# Patient Record
Sex: Male | Born: 1995
Health system: Southern US, Community
[De-identification: ages and names within clinical notes are randomized; demographics above are authoritative.]

## PROBLEM LIST (undated history)

## (undated) DIAGNOSIS — J45909 Unspecified asthma, uncomplicated: Secondary | ICD-10-CM

## (undated) DIAGNOSIS — J40 Bronchitis, not specified as acute or chronic: Secondary | ICD-10-CM

## (undated) DIAGNOSIS — IMO0001 Reserved for inherently not codable concepts without codable children: Secondary | ICD-10-CM

## (undated) HISTORY — DX: Reserved for inherently not codable concepts without codable children: IMO0001

---
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
## 2016-05-01 ENCOUNTER — Ambulatory Visit (INDEPENDENT_AMBULATORY_CARE_PROVIDER_SITE_OTHER): Payer: 59 | Admitting: Family Medicine

## 2016-05-01 ENCOUNTER — Encounter: Payer: Self-pay | Admitting: Family Medicine

## 2016-05-01 VITALS — BP 113/74 | HR 52 | Temp 97.6°F | Resp 16 | Ht 73.5 in | Wt 154.0 lb

## 2016-05-01 DIAGNOSIS — Z0000 Encounter for general adult medical examination without abnormal findings: Secondary | ICD-10-CM | POA: Diagnosis not present

## 2016-05-01 DIAGNOSIS — Z23 Encounter for immunization: Secondary | ICD-10-CM

## 2016-05-01 DIAGNOSIS — Z111 Encounter for screening for respiratory tuberculosis: Secondary | ICD-10-CM

## 2016-05-01 LAB — POCT URINALYSIS DIPSTICK
Bilirubin, UA: NEGATIVE
Blood, UA: NEGATIVE
GLUCOSE UA: NEGATIVE
Ketones, UA: NEGATIVE
LEUKOCYTES UA: NEGATIVE
NITRITE UA: NEGATIVE
PROTEIN UA: NEGATIVE
Spec Grav, UA: 1.01
UROBILINOGEN UA: 0.2
pH, UA: 6

## 2016-05-01 LAB — HEMOGLOBIN AND HEMATOCRIT, BLOOD
HEMATOCRIT: 42.7 % (ref 36.0–49.0)
HEMOGLOBIN: 14.4 g/dL (ref 12.0–16.0)

## 2016-05-01 NOTE — Patient Instructions (Signed)
Consider seeing an optometrist due to mild decrease in visual acuity, esp if it becomes a problem seeing in classrooms at school.

## 2016-05-01 NOTE — Progress Notes (Signed)
Office Note 05/01/2016  CC:  Chief Complaint  Patient presents with  . Annual Exam   HPI:  Jay Valenzuela is a 20 y.o. White male who is here for annual health maintenance exam. Will be starting school at [employer] this fall.  He did not get to attend college last year due to some family issues.  No acute complaints. 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 He denies hearing c/o. Says occ he has problem with far vision when driving--but rare.   Past Medical History:  Diagnosis Date  . Closed fracture of tibial tubercle 01/2013   Right (referred to [place] by [facility] urgent Care on [address])    History reviewed. No pertinent surgical history.  Family History  Problem Relation Age of Onset  . Alcohol abuse Mother   . Alcohol abuse Father     Social History   Social History  . Marital status: Single    Spouse name: N/A  . Number of children: N/A  . Years of education: N/A   Occupational History  . Not on file.   Social History Main Topics  . Smoking status: Never Smoker  . Smokeless tobacco: Never Used  . Alcohol use No  . Drug use: No  . Sexual activity: Not on file   Other Topics Concern  . Not on file   Social History Narrative   Starting [employer] fall 2016, plans on going to [employer] after.     Born in [place] but has lived in [place] since age 15.     Lives with GM.  Mom is an alcoholic who is not in his life, dad is alcholic/drug addict who's whereabouts are unknown.     No tob/alc/drugs.    MEDS: none  No Known Allergies  ROS Review of Systems  Constitutional: Negative for appetite change, chills, fatigue and fever.  HENT: Negative for congestion, dental problem, ear pain and sore throat.   Eyes: Negative for discharge, redness and visual disturbance.  Respiratory: Negative for cough, chest tightness, shortness of breath and wheezing.   Cardiovascular: Negative for chest pain, palpitations and leg swelling.  Gastrointestinal: Negative  for abdominal pain, blood in stool, diarrhea, nausea and vomiting.  Genitourinary: Negative for difficulty urinating, dysuria, flank pain, frequency, hematuria and urgency.  Musculoskeletal: Negative for arthralgias, back pain, joint swelling, myalgias and neck stiffness.  Skin: Negative for pallor and rash.  Neurological: Negative for dizziness, speech difficulty, weakness and headaches.  Hematological: Negative for adenopathy. Does not bruise/bleed easily.  Psychiatric/Behavioral: Negative for confusion and sleep disturbance. The patient is not nervous/anxious.     PE; Blood pressure 113/74, pulse (!) 52, temperature 97.6 F (36.4 C), temperature source Oral, resp. rate 16, height 6' 1.5" (1.867 m), weight 154 lb (69.9 kg), SpO2 100 %. Gen: Alert, well appearing.  Patient is oriented to person, place, time, and situation. AFFECT: pleasant, lucid thought and speech. ENT: Ears: EACs clear, normal epithelium.  TMs with good light reflex and landmarks bilaterally.  Eyes: no injection, icteris, swelling, or exudate.  EOMI, PERRLA. Nose: no drainage or turbinate edema/swelling.  No injection or focal lesion.  Mouth: lips without lesion/swelling.  Oral mucosa pink and moist.  Dentition intact and without obvious caries or gingival swelling.  Oropharynx without erythema, exudate, or swelling.  Neck: supple/nontender.  No LAD, mass, or TM.  Carotid pulses 2+ bilaterally, without bruits. CV: RRR, no m/r/g.   LUNGS: CTA bilat, nonlabored resps, good aeration in all lung fields.  ABD: soft, NT, ND, BS normal.  No hepatospenomegaly or mass.  No bruits. EXT: no clubbing, cyanosis, or edema.  Musculoskeletal: no joint swelling, erythema, warmth, or tenderness.  ROM of all joints intact. Skin - no sores or suspicious lesions or rashes or color changes   Pertinent labs:  UA: normal  Vision screen: 20/20 OU, 20/30 OS, 20/40 OD.  ASSESSMENT AND PLAN:   Health maintenance exam: Reviewed age and gender  appropriate health maintenance issues (prudent diet, regular exercise, health risks of tobacco and excessive alcohol, use of seatbelts, fire alarms in home, use of sunscreen).  Also reviewed age and gender appropriate health screening as well as vaccine recommendations. Tdap today as well as TB skin test (as per his school's requirements--[employer]). UA today--normal, also as per school's requirements.   Hb/Hct sent out today as per school form's requirement. Recommended pt see optometrist for further evaluation of mild decreased visual acuity.  An After Visit Summary was printed and given to the patient.  FOLLOW UP:  Return in about 1 year (around 05/01/2017).  Signed:  Crissie Sickles, MD           05/01/2016

## 2016-05-02 ENCOUNTER — Encounter: Payer: Self-pay | Admitting: *Deleted

## 2016-06-12 ENCOUNTER — Encounter: Payer: Self-pay | Admitting: Family Medicine

## 2016-06-12 ENCOUNTER — Ambulatory Visit (INDEPENDENT_AMBULATORY_CARE_PROVIDER_SITE_OTHER): Payer: 59 | Admitting: Family Medicine

## 2016-06-12 VITALS — BP 129/72 | HR 88 | Temp 98.2°F | Resp 20 | Wt 150.1 lb

## 2016-06-12 DIAGNOSIS — R0602 Shortness of breath: Secondary | ICD-10-CM

## 2016-06-12 DIAGNOSIS — R072 Precordial pain: Secondary | ICD-10-CM | POA: Diagnosis not present

## 2016-06-12 DIAGNOSIS — R0789 Other chest pain: Secondary | ICD-10-CM

## 2016-06-12 DIAGNOSIS — R011 Cardiac murmur, unspecified: Secondary | ICD-10-CM

## 2016-06-12 DIAGNOSIS — R000 Tachycardia, unspecified: Secondary | ICD-10-CM

## 2016-06-12 NOTE — Progress Notes (Signed)
Pre visit review using our clinic review tool, if applicable. No additional management support is needed unless otherwise documented below in the visit note. 

## 2016-06-12 NOTE — Progress Notes (Signed)
OFFICE VISIT  06/12/2016   CC:  Chief Complaint  Patient presents with  . Cough    chest pressure, shortness of breath x 2 days   HPI:    Patient is a 20 y.o. Caucasian male who presents for a feeling of SOB and pressure/tightness in chest. Onset 2 days ago, initially felt tired, a little dizzy, some pressure in central chest that was mild/intermittent at first and got much worse last night.  Chest pain/discomfort worse lying supine, alleviated some by sitting upright or a little forward.  Slight dry cough today.  No nasal cong/runny nose, no ST.  Denies hearing any wheezing. When he walks around he feels dizzy and out of breath.  No fevers.  No rash.  No myalgias/arthralgias.  Past Medical History:  Diagnosis Date  . Closed fracture of tibial tubercle 01/2013   Right (referred to [place] by [facility] urgent [facility] on [address])    No past surgical history on file.  No outpatient prescriptions prior to visit.   No facility-administered medications prior to visit.     No Known Allergies  ROS As per HPI  PE: Blood pressure 129/72, pulse 88, temperature 98.2 F (36.8 C), temperature source Oral, resp. rate 20, weight 150 lb 1.9 oz (68.1 kg), SpO2 98 %. Gen: Alert, well appearing.  Patient is oriented to person, place, time, and situation. ENT: Ears: EACs clear, normal epithelium.  TMs with good light reflex and landmarks bilaterally.  Eyes: no injection, icteris, swelling, or exudate.  EOMI, PERRLA. Nose: no drainage or turbinate edema/swelling.  No injection or focal lesion.  Mouth: lips without lesion/swelling.  Oral mucosa pink and moist.  Dentition intact and without obvious caries or gingival swelling.  Oropharynx without erythema, exudate, or swelling.  Neck - No masses or thyromegaly or limitation in range of motion CV: RRR, 2/6 systolic murmur heard best at left lower sternal border and apex.  No rub or gallop. I then listened after another 10 min and his murmur  could not be heard. Chest is clear, no wheezing or rales. Normal symmetric air entry throughout both lung fields. No chest wall deformities or tenderness. EXT: no clubbing, cyanosis, or edema.  SKIN: no rash  LABS:   12 lead EKG today: sinus rhythm, rate 64, right atrial enlargement.  No prior for comparison.  IMPRESSION AND PLAN:  1) SOB x 2d, + chest tightness and intermittent racing heart. He does not have any signs that this is asthma/RAD. His heart murmur (new) seems to come and go and he has no lung sounds or periph edema that would be c/w HF.  At this point:  Want to r/o PE first with CT angio chest ASAP.  Other possibilities (that CT may also be helpful with) are myocarditis and pericarditis.  If all imaging neg and nothing to support these dx, then will obtain echocardiogram. Check CBC w/diff, CMET, and TSH.  No meds rx'd today.  Spent 40 min with pt today, with >50% of this time spent in counseling and care coordination regarding the above problems.  FOLLOW UP: To be determined based on results of pending workup.  Signed:  Crissie Sickles, MD           06/12/2016

## 2016-06-13 ENCOUNTER — Telehealth: Payer: Self-pay | Admitting: Family Medicine

## 2016-06-13 ENCOUNTER — Ambulatory Visit (HOSPITAL_BASED_OUTPATIENT_CLINIC_OR_DEPARTMENT_OTHER): Admission: RE | Admit: 2016-06-13 | Payer: 59 | Source: Ambulatory Visit

## 2016-06-13 LAB — CBC WITH DIFFERENTIAL/PLATELET
BASOS ABS: 0 10*3/uL (ref 0.0–0.1)
Basophils Relative: 0.4 % (ref 0.0–3.0)
Eosinophils Absolute: 0.2 10*3/uL (ref 0.0–0.7)
Eosinophils Relative: 3 % (ref 0.0–5.0)
HEMATOCRIT: 43.5 % (ref 36.0–49.0)
HEMOGLOBIN: 15.1 g/dL (ref 12.0–16.0)
LYMPHS PCT: 23.7 % — AB (ref 24.0–48.0)
Lymphs Abs: 1.8 10*3/uL (ref 0.7–4.0)
MCHC: 34.6 g/dL (ref 31.0–37.0)
MCV: 93.5 fl (ref 78.0–98.0)
MONOS PCT: 5.6 % (ref 3.0–12.0)
Monocytes Absolute: 0.4 10*3/uL (ref 0.1–1.0)
NEUTROS ABS: 5 10*3/uL (ref 1.4–7.7)
Neutrophils Relative %: 67.3 % (ref 43.0–71.0)
PLATELETS: 301 10*3/uL (ref 150.0–575.0)
RBC: 4.65 Mil/uL (ref 3.80–5.70)
RDW: 12.6 % (ref 11.4–15.5)
WBC: 7.5 10*3/uL (ref 4.5–13.5)

## 2016-06-13 LAB — COMPREHENSIVE METABOLIC PANEL
ALBUMIN: 4.8 g/dL (ref 3.5–5.2)
ALK PHOS: 61 U/L (ref 52–171)
ALT: 12 U/L (ref 0–53)
AST: 15 U/L (ref 0–37)
BILIRUBIN TOTAL: 0.7 mg/dL (ref 0.2–1.2)
BUN: 10 mg/dL (ref 6–23)
CALCIUM: 9.8 mg/dL (ref 8.4–10.5)
CO2: 30 mEq/L (ref 19–32)
Chloride: 103 mEq/L (ref 96–112)
Creatinine, Ser: 0.85 mg/dL (ref 0.40–1.50)
GFR: 123.06 mL/min (ref >60.00)
Glucose, Bld: 70 mg/dL (ref 70–99)
Potassium: 4.4 mEq/L (ref 3.5–5.1)
Sodium: 140 mEq/L (ref 135–145)
TOTAL PROTEIN: 7.6 g/dL (ref 6.0–8.3)

## 2016-06-13 LAB — TSH: TSH: 1.67 u[IU]/mL (ref 0.40–5.00)

## 2016-06-13 NOTE — Telephone Encounter (Signed)
Carolynn from the [facility] called today to let us know that this patient called to cancel his appt for hie PE CT scan.  Patient stated that he "felt better today".

## 2016-06-13 NOTE — Telephone Encounter (Signed)
Noted  

## 2016-06-18 ENCOUNTER — Ambulatory Visit: Payer: 59 | Admitting: Family Medicine

## 2016-10-30 ENCOUNTER — Ambulatory Visit (INDEPENDENT_AMBULATORY_CARE_PROVIDER_SITE_OTHER): Payer: 59 | Admitting: *Deleted

## 2016-10-30 DIAGNOSIS — Z23 Encounter for immunization: Secondary | ICD-10-CM

## 2016-11-01 ENCOUNTER — Ambulatory Visit: Payer: 59

## 2018-07-07 ENCOUNTER — Ambulatory Visit (INDEPENDENT_AMBULATORY_CARE_PROVIDER_SITE_OTHER): Payer: Self-pay

## 2018-07-07 ENCOUNTER — Other Ambulatory Visit: Payer: Self-pay | Admitting: Gerontology

## 2018-07-07 DIAGNOSIS — Z0000 Encounter for general adult medical examination without abnormal findings: Secondary | ICD-10-CM

## 2018-08-11 ENCOUNTER — Encounter: Payer: Self-pay | Admitting: *Deleted

## 2019-02-26 ENCOUNTER — Encounter: Payer: 59 | Admitting: Family Medicine

## 2019-04-02 ENCOUNTER — Telehealth: Payer: Self-pay | Admitting: Family Medicine

## 2019-04-02 NOTE — Telephone Encounter (Signed)
Please schedule TOC to Dr. Jerline Pain

## 2019-04-02 NOTE — Telephone Encounter (Signed)
Called pt to schedule, No answer, LVM.

## 2019-04-02 NOTE — Telephone Encounter (Signed)
OK with me.

## 2019-04-02 NOTE — Telephone Encounter (Signed)
Patient would like to transfer care from Dr. Anitra Lauth to a [facility] provider. Please advise on approval or denial of transfer.    I included all providers at [facility] accepting patients due to no provider specified by patient. I will schedule with whomever responds first.   Thank you

## 2019-04-02 NOTE — Telephone Encounter (Signed)
Ok with transfer 

## 2020-01-17 ENCOUNTER — Other Ambulatory Visit: Payer: Self-pay

## 2020-01-17 ENCOUNTER — Encounter: Payer: Self-pay | Admitting: Family Medicine

## 2020-01-17 ENCOUNTER — Ambulatory Visit: Payer: 59 | Admitting: Family Medicine

## 2020-01-17 ENCOUNTER — Ambulatory Visit (INDEPENDENT_AMBULATORY_CARE_PROVIDER_SITE_OTHER): Payer: BC Managed Care – PPO | Admitting: Family Medicine

## 2020-01-17 VITALS — BP 133/82 | HR 72 | Temp 98.4°F | Resp 16 | Ht 73.5 in | Wt 187.4 lb

## 2020-01-17 DIAGNOSIS — Z0000 Encounter for general adult medical examination without abnormal findings: Secondary | ICD-10-CM | POA: Diagnosis not present

## 2020-01-17 DIAGNOSIS — G8929 Other chronic pain: Secondary | ICD-10-CM | POA: Diagnosis not present

## 2020-01-17 DIAGNOSIS — M222X1 Patellofemoral disorders, right knee: Secondary | ICD-10-CM | POA: Diagnosis not present

## 2020-01-17 DIAGNOSIS — Z131 Encounter for screening for diabetes mellitus: Secondary | ICD-10-CM

## 2020-01-17 DIAGNOSIS — S86891A Other injury of other muscle(s) and tendon(s) at lower leg level, right leg, initial encounter: Secondary | ICD-10-CM | POA: Diagnosis not present

## 2020-01-17 DIAGNOSIS — M25561 Pain in right knee: Secondary | ICD-10-CM | POA: Diagnosis not present

## 2020-01-17 DIAGNOSIS — Z13220 Encounter for screening for lipoid disorders: Secondary | ICD-10-CM

## 2020-01-17 NOTE — Patient Instructions (Signed)
Patellar Tendinitis Rehab Ask your health care provider which exercises are safe for you. Do exercises exactly as told by your health care provider and adjust them as directed. It is normal to feel mild stretching, pulling, tightness, or discomfort as you do these exercises. Stop right away if you feel sudden pain or your pain gets worse. Do not begin these exercises until told by your health care provider. Stretching and range-of-motion exercise This exercise warms up your muscles and joints and improves the movement and flexibility of your knee. The exercise also helps to relieve pain and stiffness. Hamstring, doorway stretch This is an exercise in which you lie in a doorway and prop your leg on a wall to stretch the back of your knee and thigh (hamstring). 1. Lie on your back in front of a doorway with your left / right leg resting against the wall and your other leg flat on the floor in the doorway. There should be a slight bend in your left / right knee. 2. Straighten your left / right knee. You should feel a stretch behind your knee or thigh. If you do not, scoot your buttocks closer to the door. 3. Hold this position for __________ seconds. Repeat __________ times. Complete this exercise __________ times a day. Strengthening exercises These exercises build strength and endurance in your knee. Endurance is the ability to use your muscles for a long time, even after they get tired. Quadriceps, isometric This exercise stretches the muscles in front of your thigh (quadriceps) without moving your knee joint (isometric). 1. Lie on your back with your left / right leg extended and your other knee bent. 2. Slowly tense the muscles in the front of your left / right thigh. When you do this, you should see your kneecap slide up toward your hip or see increased dimpling just above the knee. This motion will push the back of your knee toward the floor. If this is painful, try putting a rolled-up hand towel  under your knee to support it in a bent position. Change the size of the towel to find a position that allows you to do this exercise without any pain. 3. For __________ seconds, hold the muscle as tight as you can without increasing your pain. 4. Relax the muscles slowly and completely. Repeat __________ times. Complete this exercise __________ times a day. Straight leg raises This exercise stretches the muscles in front of your thigh (quadriceps) and the muscles that move your hips (hip flexors). 1. Lie on your back with your left / right leg extended and your other knee bent. 2. Tense the muscles in the front of your left / right thigh. When you do this, you should see your kneecap slide up or see increased dimpling just above the knee. 3. Keep these muscles tight as you raise your leg 4-6 inches (10-15 cm) off the floor. Do not let your moving knee bend. 4. Hold this position for __________ seconds. 5. Keep these muscles tense as you slowly lower your leg. 6. Relax your muscles slowly and completely. Repeat __________ times. Complete this exercise __________ times a day. Squats This is a weight-bearing exercise in which you bend your knees and lower your hips while engaging your thigh muscles. 1. Stand in front of a table, with your feet and knees pointing straight ahead. You may rest your hands on the table for balance but not for support. 2. Slowly bend your knees and lower your hips like you are going to   sit in a chair. ? Keep your weight over your heels, not over your toes. ? Keep your lower legs upright so they are parallel with the table legs. ? Do not let your hips go lower than your knees. ? Do not bend lower than told by your health care provider. ? If your knee pain increases, do not bend as low. 3. Hold the squat position for __________ seconds. 4. Slowly push with your legs to return to standing. Do not use your hands to pull yourself to standing. Repeat __________ times.  Complete this exercise __________ times a day. Step-downs This is an exercise in which you step down slowly while engaging your leg muscles. 1. Stand on the edge of a step. 2. Keeping your weight over your __________ heel, slowly bend your __________ knee to bring your __________ heel toward the floor. Lower your heel as far as you can while keeping control and without increasing any discomfort. ? Do not let your __________ knee come forward. ? Use your leg muscles, not gravity, to lower your body. ? Hold a wall or rail for balance if needed. 3. Slowly push through your heel to lift your body weight back up. 4. Return to the starting position. Repeat __________ times. Complete this exercise __________ times a day. Straight leg raises This exercise strengthens the muscles that rotate the leg at the hip and move it away from your body (hip abductors). 1. Lie on your side with your left / right leg in the top position. Lie so your head, shoulder, knee, and hip line up. You may bend your lower knee to help you keep your balance. 2. Roll your hips slightly forward, so that your hips are stacked directly over each other and your left / right knee is facing forward. 3. Leading with your heel, lift your top leg 4-6 inches (10-15 cm). You should feel the muscles in your outer hip lifting. ? Do not let your foot drift forward. ? Do not let your knee roll toward the ceiling. 4. Hold this position for __________ seconds. 5. Slowly lower your leg to the starting position. 6. Let your muscles relax completely after each repetition. Repeat __________ times. Complete this exercise __________ times a day. This information is not intended to replace advice given to you by your health care provider. Make sure you discuss any questions you have with your health care provider. Document Revised: 12/31/2018 Document Reviewed: 06/30/2018 Elsevier Patient Education  2020 Elsevier Inc.  

## 2020-01-17 NOTE — Progress Notes (Signed)
CC: Jay Valenzuela is a 24 y.o. White male who is here for annual health maintenance exam and f/u hist of right knee pain.  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 -feels it more when lifting heavy machinery  -knee brace helps  -when walking, feels like leg is loose and "sagging"  -sharp shooting pain down leg and up thigh posteriorly ; tingles in feet  -Dr Pearline Cables at [facility] or something" years ago  -feels like "buckles" and stretches" knee sometimes when on feet for long work days  -has been intermittent since 2014, will sometimes improve and go away but this past month or so has been longest stretch of time it has been bothering him  -freezeice helps pain  -exercise: work at [employer], walk occasionally  -diet: healthy, fruits and veggies and some carbs  -sleeping: hard time falling asleep / staying asleep ; stress related "work dreams" ; avg 5.5hrs/night   --sleeps better in day than at night when able to nap  -no drugs or smoking or vaping  -3 drinks/month -sexually active with girlfriend using contraception (condoms)  -tried Zyrtec for allergies last week and did not like it      PMH: Past Medical History:  Diagnosis Date  . Closed fracture of tibial tubercle 01/2013   Right (referred to [facility] by [facility] on [address])    M/A: No current outpatient medications on file prior to visit.   No current facility-administered medications on file prior to visit.   No Known Allergies  FH: Family History  Problem Relation Age of Onset  . Alcohol abuse Mother   . Alcohol abuse Father     SH: Social History   Socioeconomic History  . Marital status: Single    Spouse name: Not on file  . Number of children: Not on file  . Years of education: Not on file  . Highest education level: Not on file   Occupational History  . Not on file  Tobacco Use  . Smoking status: Never Smoker  . Smokeless tobacco: Never Used  Substance and Sexual Activity  . Alcohol use: No  . Drug use: No  . Sexual activity: Not on file  Other Topics Concern  . Not on file  Social History Narrative   Starting [employer] fall 2016, plans on going to [employer] after.     Born in [place] but has lived in [place] since age 63.     Lives with GM.  Mom is an alcoholic who is not in his life, dad is alcholic/drug addict who's whereabouts are unknown.     No tob/alc/drugs.   Social Determinants of Health   Financial Resource Strain:   . Difficulty of Paying Living Expenses:   Food Insecurity:   . Worried About Charity fundraiser in the Last Year:   . Arboriculturist in the Last Year:   Transportation Needs:   . Film/video editor (Medical):   Marland Kitchen Lack of Transportation (Non-Medical):   Physical Activity:   . Days of Exercise per Week:   . Minutes of Exercise per Session:   Stress:   . Feeling of Stress :   Social Connections:   . Frequency of Communication with Friends and Family:   . Frequency of Social Gatherings with Friends and Family:   . Attends Religious Services:   .  Active Member of Clubs or Organizations:   . Attends Archivist Meetings:   Marland Kitchen Marital Status:     ROS:. Review of Systems  Constitutional: Negative.   HENT: Negative.   Eyes: Negative.   Respiratory: Negative.   Cardiovascular: Negative.   Gastrointestinal: Negative.   Genitourinary: Negative.   Musculoskeletal: Positive for joint pain (knee pain).  Skin: Negative.   Neurological: Negative.   Endo/Heme/Allergies: Negative.   Psychiatric/Behavioral: Negative.     PE: Vitals with BMI 01/17/2020 06/12/2016 05/01/2016  Height 6' 1.5" - 6' 1.5"  Weight 187 lbs 6 oz 150 lbs 2 oz 154 lbs  BMI [id number] - [id number]  Systolic - [id number]  Diastolic - 72 74  Pulse - 88 52    Physical Exam  Constitutional: He is oriented to person,  place, and time and well-developed, well-nourished, and in no distress. No distress.  HENT:  Head: Normocephalic and atraumatic.  Eyes: Pupils are equal, round, and reactive to light.  Neck: No JVD present. No tracheal deviation present. No thyromegaly present.  Cardiovascular: Normal rate, regular rhythm, normal heart sounds and intact distal pulses. Exam reveals no gallop and no friction rub.  No murmur heard. Pulmonary/Chest: Effort normal and breath sounds normal. No stridor. No respiratory distress. He has no wheezes. He has no rales. He exhibits no tenderness.  Musculoskeletal:        General: Tenderness present.     Cervical back: Normal range of motion and neck supple.     Right knee: Tenderness present over the patellar tendon (pain on extension of knee).  Lymphadenopathy:    He has no cervical adenopathy.  Neurological: He is alert and oriented to person, place, and time.  Skin: Skin is warm and dry. No rash noted. He is not diaphoretic. No erythema. No pallor.  Psychiatric: Mood, memory, affect and judgment normal.  R knee no effusion, warmth, or erythema.  ROM intact.  McMurray's neg.  NO instability with varus/valgus stress.  Ant/post drawer neg laxity.  +tender over patellar tendon and at tibial tuberosity, but area of most tenderness is around inferomedial border of patella.  Strength intact.  Labs: No results found for this or any previous visit (from the past 2160 hour(s)).   A/P: In summary, Jay Valenzuela is a 24 year old man with a past medical history of right knee pain and is s/p fracture of R tibial tuberosity about 6 yrs ago-- who presents for annual wellness check and follow up on knee pain.  Physical exam and hx supportive of pain in tibial tuberosity region, patellar tendon, and soft tissues at inferomedial border of patella. We reviewed age and gender appropriate health maintenance issues (prudent diet, regular exercise, health risks of tobacco and excessive alcohol,  use of seatbelts, fire alarms in home, use of sunscreen). Also reviewed age and gender appropriate health screening as well as vaccine recommendations.  R knee pain is combo of chronic patellar tendonitis and patellofemoral pain.  With hx of fracture of tibial tuberosity + ongoing waxing and waning pain since that time, will check plain films R knee. Rest, ice, compression (knee brace), elevation, ibuprofen/NSAID prn for inflammation and active pain, patellar tendon home rehab exercises  Vaccines: Tdap UTD. Recommended getting COVID vaccine.   Signed: Bennie Dallas, MS3 17 January 2020   I personally was present during the history, physical exam, and medical decision-making activities of this service and have verified that the service and findings are accurately documented in the student's  note. Signed:  Crissie Sickles, MD           01/17/2020

## 2020-01-17 NOTE — Progress Notes (Signed)
See med student note above. Signed:  Crissie Sickles, MD           01/17/2020

## 2020-01-19 ENCOUNTER — Telehealth: Payer: Self-pay | Admitting: Family Medicine

## 2020-01-19 DIAGNOSIS — M25561 Pain in right knee: Secondary | ICD-10-CM

## 2020-01-19 NOTE — Telephone Encounter (Signed)
Orders-only encounter. Signed:  Crissie Sickles, MD           01/19/2020

## 2020-08-28 DIAGNOSIS — S46001A Unspecified injury of muscle(s) and tendon(s) of the rotator cuff of right shoulder, initial encounter: Secondary | ICD-10-CM | POA: Diagnosis not present

## 2021-01-04 ENCOUNTER — Other Ambulatory Visit: Payer: Self-pay

## 2021-01-04 ENCOUNTER — Emergency Department (HOSPITAL_BASED_OUTPATIENT_CLINIC_OR_DEPARTMENT_OTHER): Payer: BLUE CROSS/BLUE SHIELD

## 2021-01-04 ENCOUNTER — Emergency Department (HOSPITAL_BASED_OUTPATIENT_CLINIC_OR_DEPARTMENT_OTHER)
Admission: EM | Admit: 2021-01-04 | Discharge: 2021-01-04 | Disposition: A | Discharge status: Home / Self Care | Payer: BLUE CROSS/BLUE SHIELD | Attending: Emergency Medicine | Admitting: Emergency Medicine

## 2021-01-04 ENCOUNTER — Encounter (HOSPITAL_BASED_OUTPATIENT_CLINIC_OR_DEPARTMENT_OTHER): Payer: Self-pay

## 2021-01-04 DIAGNOSIS — S91312A Laceration without foreign body, left foot, initial encounter: Secondary | ICD-10-CM | POA: Insufficient documentation

## 2021-01-04 DIAGNOSIS — W25XXXA Contact with sharp glass, initial encounter: Secondary | ICD-10-CM | POA: Diagnosis not present

## 2021-01-04 DIAGNOSIS — S99922A Unspecified injury of left foot, initial encounter: Secondary | ICD-10-CM | POA: Diagnosis not present

## 2021-01-04 DIAGNOSIS — S60459A Superficial foreign body of unspecified finger, initial encounter: Secondary | ICD-10-CM | POA: Diagnosis not present

## 2021-01-04 MED ORDER — LIDOCAINE-EPINEPHRINE (PF) 2 %-1:200000 IJ SOLN
10.0000 mL | Freq: Once | INTRAMUSCULAR | Status: AC
  Administered 2021-01-04: 10 mL via INTRADERMAL
  Filled 2021-01-04: qty 20

## 2021-01-04 NOTE — Discharge Instructions (Signed)
Return for redness drainage or if you get a fever.  They should be removed between day10-14.  This can be done here, at urgent care or at your family doctors office.  The area can get wet but not fully immersed underwater.  No scrubbing.  If you really want to clean it you can apply a half-and-half hydrogen peroxide solution with water on a Q-tip.  You can apply an ointment a couple times a day this could be as simple as Vaseline but could also be an antibiotic ointment if you wish.

## 2021-01-04 NOTE — ED Notes (Signed)
ED Provider at bedside. 

## 2021-01-04 NOTE — ED Provider Notes (Signed)
[facility] Provider Note   CSN: [id number] Arrival date & time: 01/04/21  2044     History Chief Complaint  Patient presents with  . Leg Injury    Jay Valenzuela. Jay Valenzuela is a 25 y.o. male.  25 yo M with a chief complaints of stepping on a piece of glass.  This occurred just prior to arrival.  Thinks he got the whole piece of glass out.  Denies other injury.  Thinks his tetanus is up-to-date.  The history is provided by the patient.  Injury This is a new problem. The current episode started 1 to 2 hours ago. The problem occurs constantly. The problem has not changed since onset.Pertinent negatives include no chest pain, no abdominal pain, no headaches and no shortness of breath. The symptoms are aggravated by bending and twisting. Nothing relieves the symptoms. He has tried nothing for the symptoms.       Past Medical History:  Diagnosis Date  . Closed fracture of tibial tubercle 01/2013   Right (referred to [facility] by [facility] urgent Care on [address])    Patient Active Problem List   Diagnosis Date Noted  . Atypical nevus 04/21/2014  . Well child check 07/20/2012    History reviewed. No pertinent surgical history.     Family History  Problem Relation Age of Onset  . Alcohol abuse Mother   . Alcohol abuse Father     Social History   Tobacco Use  . Smoking status: Never Smoker  . Smokeless tobacco: Never Used  Substance Use Topics  . Alcohol use: Yes    Comment: 2x a wk   . Drug use: No    Home Medications Prior to Admission medications   Not on File    Allergies    Patient has no known allergies.  Review of Systems   Review of Systems  Constitutional: Negative for chills and fever.  HENT: Negative for congestion and facial swelling.   Eyes: Negative for discharge and visual disturbance.  Respiratory: Negative for shortness of breath.   Cardiovascular: Negative for chest pain and palpitations.  Gastrointestinal:  Negative for abdominal pain, diarrhea and vomiting.  Musculoskeletal: Negative for arthralgias and myalgias.  Skin: Positive for wound. Negative for color change and rash.  Neurological: Negative for tremors, syncope and headaches.  Psychiatric/Behavioral: Negative for confusion and dysphoric mood.    Physical Exam Updated Vital Signs BP 128/78 (BP Location: Left Arm)   Pulse 69   Temp 98.3 F (36.8 C) (Oral)   Resp 20   Ht 6\' 2"  (1.88 m)   Wt 92.5 kg   SpO2 98%   BMI 26.19 kg/m   Physical Exam Vitals and nursing note reviewed.  Constitutional:      Appearance: He is well-developed.  HENT:     Head: Normocephalic and atraumatic.  Eyes:     Pupils: Pupils are equal, round, and reactive to light.  Neck:     Vascular: No JVD.  Cardiovascular:     Rate and Rhythm: Normal rate and regular rhythm.     Heart sounds: No murmur heard. No friction rub. No gallop.   Pulmonary:     Effort: No respiratory distress.     Breath sounds: No wheezing.  Abdominal:     General: There is no distension.     Tenderness: There is no guarding or rebound.  Musculoskeletal:        General: Normal range of motion.     Cervical back: Normal  range of motion and neck supple.     Comments: Laceration to the plantar aspect of the foot about the lateral aspect on the metatarsal phalangeal joint.  No obvious foreign body on inspection.  Skin:    Coloration: Skin is not pale.     Findings: No rash.  Neurological:     Mental Status: He is alert and oriented to person, place, and time.  Psychiatric:        Behavior: Behavior normal.     ED Results / Procedures / Treatments   Labs (all labs ordered are listed, but only abnormal results are displayed) Labs Reviewed - No data to display  EKG None  Radiology DG Foot Complete Left  Result Date: 01/04/2021 CLINICAL DATA:  Pain towards the fifth digit, recently stepped on glass EXAM: LEFT FOOT - COMPLETE 3+ VIEW COMPARISON:  None. FINDINGS: Tiny  2 mm linear radiodense foreign body seen medial to the fifth proximal phalanx compatible with suspected glass foreign body. A more subtle 2 mm linear radiodensity is seen along the lateral aspect of the fifth middle phalanx as well, could reflect a a projection of soft tissues versus additional foreign body. No acute bony abnormality. Specifically, no fracture, subluxation, or dislocation. IMPRESSION: 1. Tiny 2 mm linear radiodense foreign body medial to the fifth proximal phalanx compatible with glass foreign body. 2. More subtle linear radiodensity along the lateral aspect of the fifth middle phalanx may reflect a projection of soft tissues versus additional foreign body. Electronically Signed   By: Lovena Le M.D.   On: 01/04/2021 22:08    Procedures .Marland KitchenLaceration Repair  Date/Time: 01/04/2021 10:45 PM Performed by: Deno Etienne, DO Authorized by: Deno Etienne, DO   Consent:    Consent obtained:  Verbal   Consent given by:  Patient   Risks, benefits, and alternatives were discussed: yes     Risks discussed:  Infection, pain, poor cosmetic result and poor wound healing   Alternatives discussed:  No treatment, delayed treatment and observation Universal protocol:    Procedure explained and questions answered to patient or proxy's satisfaction: yes     Immediately prior to procedure, a time out was called: yes     Patient identity confirmed:  Verbally with patient Anesthesia:    Anesthesia method:  Local infiltration   Local anesthetic:  Lidocaine 2% WITH epi Laceration details:    Location:  Foot   Foot location:  Sole of L foot   Length (cm):  2.7 Pre-procedure details:    Preparation:  Patient was prepped and draped in usual sterile fashion Exploration:    Limited defect created (wound extended): no     Hemostasis achieved with:  Epinephrine and direct pressure   Imaging obtained: bedside ultrasound     Imaging outcome: foreign body noted     Wound exploration: wound explored through  full range of motion     Contaminated: no   Treatment:    Area cleansed with:  Saline   Amount of cleaning:  Standard   Irrigation solution:  Sterile saline   Irrigation volume:  50   Irrigation method:  Pressure wash   Visualized foreign bodies/material removed: no     Debridement:  Minimal   Undermining:  Minimal   Scar revision: no   Skin repair:    Repair method:  Sutures   Suture size:  4-0   Suture material:  Nylon   Suture technique:  Simple interrupted   Number of sutures:  2 Approximation:  Approximation:  Close Repair type:    Repair type:  Simple Post-procedure details:    Dressing:  Open (no dressing)   Procedure completion:  Tolerated well, no immediate complications Comments:     Despite thorough irrigation and investigation with pickups and a hemostat I was unable to identify any foreign bodies.     Medications Ordered in ED Medications  lidocaine-EPINEPHrine (XYLOCAINE W/EPI) 2 %-1:200000 (PF) injection 10 mL (10 mLs Intradermal Given 01/04/21 2216)    ED Course  I have reviewed the triage vital signs and the nursing notes.  Pertinent labs & imaging results that were available during my care of the patient were reviewed by me and considered in my medical decision making (see chart for details).    MDM Rules/Calculators/A&P                          25 yo M with a chief complaints of foot laceration after stepping on a piece of glass.  He thinks he removed it all.  We will perform an x-ray to evaluate for further foreign body.  Suture bedside.  Plain film viewed by me with a radiopaque foreign body.  Despite this I am unable to find said foreign body on physical exam.  Thorough irrigation thorough investigation with pickups and hemostat.  Wound was closed.  PCP follow-up.  10:49 PM:  I have discussed the diagnosis/risks/treatment options with the patient and believe the pt to be eligible for discharge home to follow-up with PCP. We also discussed  returning to the ED immediately if new or worsening sx occur. We discussed the sx which are most concerning (e.g., sudden worsening pain, fever, inability to tolerate by mouth) that necessitate immediate return. Medications administered to the patient during their visit and any new prescriptions provided to the patient are listed below.  Medications given during this visit Medications  lidocaine-EPINEPHrine (XYLOCAINE W/EPI) 2 %-1:200000 (PF) injection 10 mL (10 mLs Intradermal Given 01/04/21 2216)     The patient appears reasonably screen and/or stabilized for discharge and I doubt any other medical condition or other [facility] requiring further screening, evaluation, or treatment in the ED at this time prior to discharge.   Final Clinical Impression(s) / ED Diagnoses Final diagnoses:  Laceration of left foot, initial encounter    Rx / DC Orders ED Discharge Orders    None       Deno Etienne, DO 01/04/21 2249

## 2021-01-04 NOTE — ED Triage Notes (Signed)
Pt came in for left foot injury - states he stepped on a piece on  Glass  At home at [place] - bleeding on controlled at triage -    Last Tdap  Shat was a year ago

## 2021-02-23 ENCOUNTER — Encounter (HOSPITAL_BASED_OUTPATIENT_CLINIC_OR_DEPARTMENT_OTHER): Payer: Self-pay

## 2021-02-23 ENCOUNTER — Emergency Department (HOSPITAL_BASED_OUTPATIENT_CLINIC_OR_DEPARTMENT_OTHER): Payer: No Typology Code available for payment source | Admitting: Radiology

## 2021-02-23 ENCOUNTER — Other Ambulatory Visit: Payer: Self-pay

## 2021-02-23 ENCOUNTER — Emergency Department (HOSPITAL_BASED_OUTPATIENT_CLINIC_OR_DEPARTMENT_OTHER)
Admission: EM | Admit: 2021-02-23 | Discharge: 2021-02-23 | Disposition: A | Discharge status: Home / Self Care | Payer: No Typology Code available for payment source | Attending: Emergency Medicine | Admitting: Emergency Medicine

## 2021-02-23 DIAGNOSIS — W208XXA Other cause of strike by thrown, projected or falling object, initial encounter: Secondary | ICD-10-CM | POA: Insufficient documentation

## 2021-02-23 DIAGNOSIS — S143XXA Injury of brachial plexus, initial encounter: Secondary | ICD-10-CM | POA: Diagnosis not present

## 2021-02-23 DIAGNOSIS — Y990 Civilian activity done for income or pay: Secondary | ICD-10-CM | POA: Diagnosis not present

## 2021-02-23 DIAGNOSIS — S40012A Contusion of left shoulder, initial encounter: Secondary | ICD-10-CM | POA: Diagnosis not present

## 2021-02-23 DIAGNOSIS — M25512 Pain in left shoulder: Secondary | ICD-10-CM | POA: Diagnosis not present

## 2021-02-23 DIAGNOSIS — S4992XA Unspecified injury of left shoulder and upper arm, initial encounter: Secondary | ICD-10-CM | POA: Diagnosis present

## 2021-02-23 MED ORDER — NAPROXEN 250 MG PO TABS
500.0000 mg | ORAL_TABLET | Freq: Once | ORAL | Status: AC
  Administered 2021-02-23: 500 mg via ORAL
  Filled 2021-02-23: qty 2

## 2021-02-23 MED ORDER — NAPROXEN 500 MG PO TABS: 500.0000 mg | ORAL_TABLET | Freq: Two times a day (BID) | ORAL | 0 refills | Status: AC

## 2021-02-23 MED ORDER — CYCLOBENZAPRINE HCL 10 MG PO TABS: 10.0000 mg | ORAL_TABLET | Freq: Two times a day (BID) | ORAL | 0 refills | Status: AC | PRN

## 2021-02-23 NOTE — ED Provider Notes (Signed)
[facility] Provider Note  CSN: [id number] Arrival date & time: 02/23/21 0209    History Chief Complaint  Patient presents with  . Shoulder Injury    HPI  Jay Valenzuela. Campion is a 25 y.o. male reports just prior to arrival he was at work at [employer] when a heavy packaged fell onto his L shoulder. He has had moderate to severe aching pain, radiating into his upper back, worse with movement. He reports associated tingling in his L arm since the injury. No head injury or LOC. He is right handed.    Past Medical History:  Diagnosis Date  . Closed fracture of tibial tubercle 01/2013   Right (referred to [place] by [facility] urgent [facility] on [address])    History reviewed. No pertinent surgical history.  Family History  Problem Relation Age of Onset  . Alcohol abuse Mother   . Alcohol abuse Father     Social History   Tobacco Use  . Smoking status: Never Smoker  . Smokeless tobacco: Never Used  Substance Use Topics  . Alcohol use: Yes    Comment: 2x a wk   . Drug use: No     Home Medications Prior to Admission medications   Medication Sig Start Date End Date Taking? Authorizing Provider  cyclobenzaprine (FLEXERIL) 10 MG tablet Take 1 tablet (10 mg total) by mouth 2 (two) times daily as needed for muscle spasms. 02/23/21  Yes Truddie Hidden, MD  naproxen (NAPROSYN) 500 MG tablet Take 1 tablet (500 mg total) by mouth 2 (two) times daily. 02/23/21  Yes Truddie Hidden, MD     Allergies    Patient has no known allergies.   Review of Systems   Review of Systems A comprehensive review of systems was completed and negative except as noted in HPI.    Physical Exam BP 132/81 (BP Location: Right Arm)   Pulse 93   Temp 98.3 F (36.8 C) (Oral)   Resp 18   Ht 6\' 2"  (1.88 m)   Wt 94.3 kg   SpO2 98%   BMI 26.71 kg/m   Physical Exam Vitals and nursing note reviewed.  HENT:     Head: Normocephalic.     Nose: Nose normal.  Eyes:      Extraocular Movements: Extraocular movements intact.  Neck:     Comments: Tenderness over the L cervical paraspinal and trapezius muscles Pulmonary:     Effort: Pulmonary effort is normal.  Musculoskeletal:        General: Normal range of motion.     Cervical back: Neck supple.     Comments: Tender over the distal L clavicle and AC joint.   Skin:    Findings: No rash (on exposed skin).  Neurological:     Mental Status: He is alert and oriented to person, place, and time.     Comments: Subjective decreased sensation in LUE, mostly distal; decreased grip strength due to pain  Psychiatric:        Mood and Affect: Mood normal.      ED Results / Procedures / Treatments   Labs (all labs ordered are listed, but only abnormal results are displayed) Labs Reviewed - No data to display  EKG None   Radiology DG Shoulder Left  Result Date: 02/23/2021 CLINICAL DATA:  Tenderness at distal clavicle EXAM: LEFT SHOULDER - 2+ VIEW COMPARISON:  None. FINDINGS: There is no evidence of fracture or dislocation. There is no evidence of arthropathy or  other focal bone abnormality. Soft tissues are unremarkable. IMPRESSION: Negative. Electronically Signed   By: Donavan Foil M.D.   On: 02/23/2021 02:39    Procedures Procedures  Medications Ordered in the ED Medications  naproxen (NAPROSYN) tablet 500 mg (has no administration in time range)     MDM Rules/Calculators/A&P MDM Patient with blunt injury to L shoulder. Will check xrays to eval bony injury. Also suspect a brachial plexus injury given his tingling.  ED Course  I have reviewed the triage vital signs and the nursing notes.  Pertinent labs & imaging results that were available during my care of the patient were reviewed by me and considered in my medical decision making (see chart for details).  Clinical Course as of 02/23/21 0247  Fri Feb 23, 2021  0244 Xray images and results reviewed, no fracture. Will give naprosyn, sling  for comfort. Rx for flexeril and Ortho referral.  [CS]    Clinical Course User Index [CS] Truddie Hidden, MD    Final Clinical Impression(s) / ED Diagnoses Final diagnoses:  Brachial plexus injury, left, initial encounter  Contusion of left shoulder, initial encounter    Rx / DC Orders ED Discharge Orders         Ordered    naproxen (NAPROSYN) 500 MG tablet  2 times daily        02/23/21 0246    cyclobenzaprine (FLEXERIL) 10 MG tablet  2 times daily PRN        02/23/21 0246           Truddie Hidden, MD 02/23/21 [phone]

## 2021-02-23 NOTE — ED Triage Notes (Signed)
Pt states he was at work and something fell on his shoulder and instantly his arm went numb. Pain radiates to back.

## 2021-04-08 ENCOUNTER — Emergency Department (HOSPITAL_BASED_OUTPATIENT_CLINIC_OR_DEPARTMENT_OTHER)
Admission: EM | Admit: 2021-04-08 | Discharge: 2021-04-08 | Disposition: A | Discharge status: Home / Self Care | Payer: BLUE CROSS/BLUE SHIELD | Attending: Emergency Medicine | Admitting: Emergency Medicine

## 2021-04-08 ENCOUNTER — Encounter (HOSPITAL_BASED_OUTPATIENT_CLINIC_OR_DEPARTMENT_OTHER): Payer: Self-pay

## 2021-04-08 ENCOUNTER — Emergency Department (HOSPITAL_BASED_OUTPATIENT_CLINIC_OR_DEPARTMENT_OTHER): Payer: BLUE CROSS/BLUE SHIELD

## 2021-04-08 ENCOUNTER — Other Ambulatory Visit: Payer: Self-pay

## 2021-04-08 DIAGNOSIS — R0981 Nasal congestion: Secondary | ICD-10-CM | POA: Diagnosis not present

## 2021-04-08 DIAGNOSIS — H9203 Otalgia, bilateral: Secondary | ICD-10-CM | POA: Diagnosis not present

## 2021-04-08 DIAGNOSIS — J029 Acute pharyngitis, unspecified: Secondary | ICD-10-CM | POA: Insufficient documentation

## 2021-04-08 DIAGNOSIS — H6693 Otitis media, unspecified, bilateral: Secondary | ICD-10-CM | POA: Diagnosis not present

## 2021-04-08 DIAGNOSIS — Z20822 Contact with and (suspected) exposure to covid-19: Secondary | ICD-10-CM | POA: Diagnosis not present

## 2021-04-08 DIAGNOSIS — H6690 Otitis media, unspecified, unspecified ear: Secondary | ICD-10-CM

## 2021-04-08 DIAGNOSIS — R059 Cough, unspecified: Secondary | ICD-10-CM | POA: Diagnosis not present

## 2021-04-08 LAB — RESP PANEL BY RT-PCR (FLU A&B, COVID) ARPGX2
Influenza A by PCR: NEGATIVE
Influenza B by PCR: NEGATIVE
SARS Coronavirus 2 by RT PCR: NEGATIVE

## 2021-04-08 LAB — GROUP A STREP BY PCR: Group A Strep by PCR: NOT DETECTED

## 2021-04-08 MED ORDER — AMOXICILLIN-POT CLAVULANATE 875-125 MG PO TABS: 1.0000 | ORAL_TABLET | Freq: Two times a day (BID) | ORAL | 0 refills | Status: AC

## 2021-04-08 NOTE — ED Triage Notes (Signed)
Onset about a week of congestion and sore throat.  States fullness feeling in ears.  Fever of 100

## 2021-04-08 NOTE — ED Provider Notes (Signed)
[facility] EMERGENCY DEPT Provider Note   CSN: [id number] Arrival date & time: 04/08/21  1145     History Chief Complaint  Patient presents with   Nasal Congestion    Congestion cough and sore throat    Jay Valenzuela is a 25 y.o. male.  He is here with a complaint of a week of ringing in his ears ear pain nasal congestion sore throat and nonproductive cough.  Low-grade fevers.  Has been using over-the-counter medications without any improvement.  Had at home COVID test that was negative.  He is not vaccinated for COVID or flu.  The history is provided by the patient.  URI Presenting symptoms: congestion, cough, ear pain, fever and sore throat   Cough:    Cough characteristics:  Non-productive   Sputum characteristics:  Nondescript   Severity:  Moderate   Onset quality:  Gradual   Duration:  1 week   Timing:  Sporadic   Progression:  Unchanged   Chronicity:  New Ear pain:    Location:  Bilateral   Severity:  Moderate   Onset quality:  Gradual   Duration:  1 week   Timing:  Constant   Progression:  Unchanged   Chronicity:  New Severity:  Moderate Onset quality:  Gradual Duration:  1 week Timing:  Constant Progression:  Unchanged Chronicity:  New Relieved by:  Nothing Worsened by:  Eating Ineffective treatments:  OTC medications Associated symptoms: no headaches and no neck pain   Risk factors: no recent travel and no sick contacts       Past Medical History:  Diagnosis Date   Closed fracture of tibial tubercle 01/2013   Right (referred to [facility] by [facility] on [address])    Patient Active Problem List   Diagnosis Date Noted   Atypical nevus 04/21/2014   Well child check 07/20/2012    History reviewed. No pertinent surgical history.     Family History  Problem Relation Age of Onset   Alcohol abuse Mother    Alcohol abuse Father     Social History   Tobacco Use   Smoking status: Never   Smokeless tobacco:  Never  Substance Use Topics   Alcohol use: Yes    Comment: 2x a wk    Drug use: No    Home Medications Prior to Admission medications   Medication Sig Start Date End Date Taking? Authorizing Provider  cyclobenzaprine (FLEXERIL) 10 MG tablet Take 1 tablet (10 mg total) by mouth 2 (two) times daily as needed for muscle spasms. 02/23/21   Truddie Hidden, MD  naproxen (NAPROSYN) 500 MG tablet Take 1 tablet (500 mg total) by mouth 2 (two) times daily. 02/23/21   Truddie Hidden, MD    Allergies    Patient has no known allergies.  Review of Systems   Review of Systems  Constitutional:  Positive for fever.  HENT:  Positive for congestion, ear pain and sore throat.   Eyes:  Negative for visual disturbance.  Respiratory:  Positive for cough.   Gastrointestinal:  Negative for diarrhea.  Genitourinary:  Negative for dysuria.  Musculoskeletal:  Negative for neck pain.  Skin:  Negative for rash.  Neurological:  Negative for headaches.   Physical Exam Updated Vital Signs BP (!) 143/91 (BP Location: Left Arm)   Pulse 97   Temp 98.4 F (36.9 C) (Oral)   Resp 16   Ht 6\' 2"  (1.88 m)   Wt 95.3 kg   SpO2  97%   BMI 26.96 kg/m   Physical Exam Vitals and nursing note reviewed.  Constitutional:      Appearance: Normal appearance. He is well-developed.  HENT:     Head: Normocephalic and atraumatic.     Right Ear: Tympanic membrane is erythematous.     Left Ear: Tympanic membrane is erythematous.     Mouth/Throat:     Mouth: Mucous membranes are moist.     Pharynx: Oropharynx is clear. Uvula midline. No posterior oropharyngeal erythema or uvula swelling.     Tonsils: No tonsillar exudate or tonsillar abscesses.  Eyes:     Conjunctiva/sclera: Conjunctivae normal.  Cardiovascular:     Rate and Rhythm: Normal rate and regular rhythm.     Heart sounds: No murmur heard. Pulmonary:     Effort: Pulmonary effort is normal. No respiratory distress.     Breath sounds: Normal breath  sounds.  Abdominal:     Palpations: Abdomen is soft.     Tenderness: There is no abdominal tenderness.  Musculoskeletal:     Cervical back: Neck supple.  Skin:    General: Skin is warm and dry.  Neurological:     Mental Status: He is alert.    ED Results / Procedures / Treatments   Labs (all labs ordered are listed, but only abnormal results are displayed) Labs Reviewed  GROUP A STREP BY PCR  RESP PANEL BY RT-PCR (FLU A&B, COVID) [id number]    EKG None  Radiology DG Chest Port 1 View  Result Date: 04/08/2021 CLINICAL DATA:  25 year old male with history of coughing congestion for 1 week. EXAM: PORTABLE CHEST 1 VIEW COMPARISON:  Chest x-ray 07/07/2018. FINDINGS: Lung volumes are normal. No consolidative airspace disease. No pleural effusions. No pneumothorax. No pulmonary nodule or mass noted. Pulmonary vasculature and the cardiomediastinal silhouette are within normal limits. IMPRESSION: No radiographic evidence of acute cardiopulmonary disease. Electronically Signed   By: Vinnie Langton M.D.   On: 04/08/2021 12:56    Procedures Procedures   Medications Ordered in ED Medications - No data to display  ED Course  I have reviewed the triage vital signs and the nursing notes.  Pertinent labs & imaging results that were available during my care of the patient were reviewed by me and considered in my medical decision making (see chart for details).    MDM Rules/Calculators/A&P                         Jay Rima. Graveline was evaluated in Emergency Department on 04/08/2021 for the symptoms described in the history of present illness. He was evaluated in the context of the global COVID-19 pandemic, which necessitated consideration that the patient might be at risk for infection with the SARS-CoV-2 virus that causes COVID-19. Institutional protocols and algorithms that pertain to the evaluation of patients at risk for COVID-19 are in a state of rapid change based on information released by  regulatory bodies including the CDC and federal and state organizations. These policies and algorithms were followed during the patient's care in the ED.   Final Clinical Impression(s) / ED Diagnoses Final diagnoses:  Acute otitis media, unspecified otitis media type  Cough  Person under investigation for COVID-19    Rx / DC Orders ED Discharge Orders          Ordered    amoxicillin-clavulanate (AUGMENTIN) 875-125 MG tablet  Every 12 hours        04/08/21 1316  Hayden Rasmussen, MD 04/08/21 [phone]

## 2021-04-08 NOTE — Discharge Instructions (Addendum)
You were seen in the emergency department for evaluation of ear pain sore throat and cough.  Your chest x-ray did not show any pneumonia.  You clinically had signs of an ear infection.  Your rapid strep test was negative.  Your COVID and flu test was pending at time of discharge.  You can follow this up in [place].  We are starting you on some antibiotics.  Please drink plenty of fluids and use Tylenol and ibuprofen.  You should isolate until your COVID test is resulted.

## 2021-04-11 ENCOUNTER — Ambulatory Visit: Payer: BLUE CROSS/BLUE SHIELD | Admitting: Family Medicine

## 2021-11-08 ENCOUNTER — Ambulatory Visit: Payer: Self-pay | Admitting: Family Medicine

## 2021-11-08 ENCOUNTER — Ambulatory Visit: Payer: BLUE CROSS/BLUE SHIELD | Admitting: Family Medicine

## 2021-11-26 ENCOUNTER — Ambulatory Visit: Payer: Self-pay | Admitting: Family Medicine

## 2021-11-26 NOTE — Progress Notes (Deleted)
OFFICE VISIT ? ?11/26/2021 ? ?CC: No chief complaint on file. ? ?HPI:   ? ?Patient is a 26 y.o. male who presents for "overall health concerns". ? ?INTERIM HX: ?*** ? ?PMP AWARE reviewed today: Nothing is listed. ? ?Past Medical History:  ?Diagnosis Date  ? Closed fracture of tibial tubercle 01/2013  ? Right (referred to [facility] by [facility] urgent Care on [address])  ? ? ?No past surgical history on file. ? ?Outpatient Medications Prior to Visit  ?Medication Sig Dispense Refill  ? amoxicillin-clavulanate (AUGMENTIN) 875-125 MG tablet Take 1 tablet by mouth every 12 (twelve) hours. 14 tablet 0  ? cyclobenzaprine (FLEXERIL) 10 MG tablet Take 1 tablet (10 mg total) by mouth 2 (two) times daily as needed for muscle spasms. 20 tablet 0  ? naproxen (NAPROSYN) 500 MG tablet Take 1 tablet (500 mg total) by mouth 2 (two) times daily. 30 tablet 0  ? ?No facility-administered medications prior to visit.  ? ? ?No Known Allergies ? ?ROS ?As per HPI ? ?PE: ?Vitals with BMI 04/08/2021 04/08/2021 04/08/2021  ?Height - - '6\' 2"'$   ?Weight - - 210 lbs  ?BMI - - 26.95  ?Systolic 151 - -  ?Diastolic 94 - -  ?Pulse - 90 -  ? ?Physical Exam ? ?*** ? ?LABS:  ?Last CBC ?Lab Results  ?Component Value Date  ? WBC 7.5 06/12/2016  ? HGB 15.1 06/12/2016  ? HCT 43.5 06/12/2016  ? MCV 93.5 06/12/2016  ? RDW 12.6 06/12/2016  ? PLT 301.0 06/12/2016  ? ?Last metabolic panel ?Lab Results  ?Component Value Date  ? GLUCOSE 70 06/12/2016  ? NA 140 06/12/2016  ? K 4.4 06/12/2016  ? CL 103 06/12/2016  ? CO2 30 06/12/2016  ? BUN 10 06/12/2016  ? CREATININE 0.85 06/12/2016  ? CALCIUM 9.8 06/12/2016  ? PROT 7.6 06/12/2016  ? ALBUMIN 4.8 06/12/2016  ? BILITOT 0.7 06/12/2016  ? ALKPHOS 61 06/12/2016  ? AST 15 06/12/2016  ? ALT 12 06/12/2016  ? ? ?Last thyroid functions ?Lab Results  ?Component Value Date  ? TSH 1.67 06/12/2016  ? ?IMPRESSION AND PLAN: ? ?No problem-specific Assessment & Plan notes found for this encounter. ? ? ?An After Visit Summary was  printed and given to the patient. ? ?FOLLOW UP: No follow-ups on file. ? ?Signed:  Crissie Sickles, MD           11/26/2021 ? ?

## 2022-03-29 ENCOUNTER — Other Ambulatory Visit: Payer: Self-pay

## 2022-03-29 ENCOUNTER — Emergency Department (HOSPITAL_BASED_OUTPATIENT_CLINIC_OR_DEPARTMENT_OTHER)
Admission: EM | Admit: 2022-03-29 | Discharge: 2022-03-29 | Disposition: A | Discharge status: Home / Self Care | Payer: PRIVATE HEALTH INSURANCE | Attending: Emergency Medicine | Admitting: Emergency Medicine

## 2022-03-29 ENCOUNTER — Emergency Department (HOSPITAL_BASED_OUTPATIENT_CLINIC_OR_DEPARTMENT_OTHER): Payer: PRIVATE HEALTH INSURANCE | Admitting: Radiology

## 2022-03-29 ENCOUNTER — Encounter (HOSPITAL_BASED_OUTPATIENT_CLINIC_OR_DEPARTMENT_OTHER): Payer: Self-pay

## 2022-03-29 DIAGNOSIS — J4520 Mild intermittent asthma, uncomplicated: Secondary | ICD-10-CM | POA: Insufficient documentation

## 2022-03-29 DIAGNOSIS — Z8616 Personal history of COVID-19: Secondary | ICD-10-CM | POA: Insufficient documentation

## 2022-03-29 DIAGNOSIS — R0602 Shortness of breath: Secondary | ICD-10-CM | POA: Diagnosis present

## 2022-03-29 HISTORY — DX: Unspecified asthma, uncomplicated: J45.909

## 2022-03-29 HISTORY — DX: Bronchitis, not specified as acute or chronic: J40

## 2022-03-29 MED ORDER — FEXOFENADINE HCL 60 MG PO TABS: 60.0000 mg | ORAL_TABLET | Freq: Every day | ORAL | 0 refills | Status: AC

## 2022-03-29 MED ORDER — ALBUTEROL SULFATE HFA 108 (90 BASE) MCG/ACT IN AERS
2.0000 | INHALATION_SPRAY | RESPIRATORY_TRACT | Status: DC | PRN
  Administered 2022-03-29: 2 via RESPIRATORY_TRACT
  Filled 2022-03-29: qty 6.7

## 2022-03-29 MED ORDER — ALBUTEROL SULFATE HFA 108 (90 BASE) MCG/ACT IN AERS: 2.0000 | INHALATION_SPRAY | Freq: Four times a day (QID) | RESPIRATORY_TRACT | 0 refills | Status: AC | PRN

## 2022-03-29 NOTE — ED Triage Notes (Signed)
Pt presents with SOB d/t construction job. He is concerned if their is any relation to his current job and "flare up" of asthma   Hx of asthma and inhaler

## 2022-03-29 NOTE — ED Provider Notes (Signed)
[facility] DEPT Provider Note   CSN: [id number] Arrival date & time: 03/29/22  1106     History  Chief Complaint  Patient presents with   Shortness of Breath   Asthma    Jay Valenzuela is a 26 y.o. male.   Shortness of Breath Asthma Associated symptoms include shortness of breath.   Patient is a 26 year old male with past medical history significant for asthma  Patient states that approximately 1 year ago he contracted COVID-19 he states that since that time he has felt somewhat more short of breath when he is physically pushing his limits that he had previously.  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  He states when he is walking normally he does not become dyspneic.  He denies any chest pains no hemoptysis no leg swelling either unilaterally or bilaterally.  No history of VTE, he denies any pain syncope or near syncope.  He came to the ER today for evaluation and also inquired about FMLA because of his symptoms   He states currently no symptoms at all    Home Medications Prior to Admission medications   Medication Sig Start Date End Date Taking? Authorizing Provider  albuterol (VENTOLIN HFA) 108 (90 Base) MCG/ACT inhaler Inhale 2 puffs into the lungs every 6 (six) hours as needed for wheezing or shortness of breath. 03/29/22  Yes Phuoc Huy S, PA  fexofenadine (ALLEGRA ALLERGY) 60 MG tablet Take 1 tablet (60 mg total) by mouth daily. 03/29/22  Yes Ranay Ketter S, PA  amoxicillin-clavulanate (AUGMENTIN) 875-125 MG tablet Take 1 tablet by mouth  every 12 (twelve) hours. 04/08/21   Hayden Rasmussen, MD  cyclobenzaprine (FLEXERIL) 10 MG tablet Take 1 tablet (10 mg total) by mouth 2 (two) times daily as needed for muscle spasms. 02/23/21   Truddie Hidden, MD  naproxen (NAPROSYN) 500 MG tablet Take 1 tablet (500 mg total) by mouth 2 (two) times daily. 02/23/21   Truddie Hidden, MD      Allergies    Patient has no known allergies.    Review of Systems   Review of Systems  Respiratory:  Positive for shortness of breath.     Physical Exam Updated Vital Signs BP (!) 146/86   Pulse 80   Temp 98.4 F (36.9 C)   Resp 15   Ht '6\' 2"'$  (1.88 m)   Wt 97.5 kg   SpO2 100%   BMI 27.60 kg/m  Physical Exam Vitals and nursing note reviewed.  Constitutional:      General: He is not in acute distress. HENT:     Head: Normocephalic and atraumatic.     Nose: Nose normal.  Eyes:     General: No scleral icterus. Cardiovascular:     Rate and Rhythm: Normal rate and regular rhythm.     Pulses: Normal pulses.     Heart sounds: Normal heart sounds.  Pulmonary:     Effort: Pulmonary effort is normal. No respiratory distress.     Breath sounds: No wheezing.  Abdominal:  Palpations: Abdomen is soft.     Tenderness: There is no abdominal tenderness.  Musculoskeletal:     Cervical back: Normal range of motion.     Right lower leg: No edema.     Left lower leg: No edema.  Skin:    General: Skin is warm and dry.     Capillary Refill: Capillary refill takes less than 2 seconds.  Neurological:     Mental Status: He is alert. Mental status is at baseline.  Psychiatric:        Mood and Affect: Mood normal.        Behavior: Behavior normal.     ED Results / Procedures / Treatments   Labs (all labs ordered are listed, but only abnormal results are displayed) Labs Reviewed - No data to display  EKG None  Radiology DG Chest 2 View  Result Date: 03/29/2022 CLINICAL DATA:  Shortness of breath. EXAM: CHEST - 2 VIEW COMPARISON:   04/08/2021. FINDINGS: No consolidation. No visible pleural effusions or pneumothorax. Cardiomediastinal silhouette is within normal limits. No acute osseous abnormality. IMPRESSION: No active cardiopulmonary disease. Electronically Signed   By: Margaretha Sheffield M.D.   On: 03/29/2022 12:03    Procedures Procedures    Medications Ordered in ED Medications  albuterol (VENTOLIN HFA) 108 (90 Base) MCG/ACT inhaler 2 puff (2 puffs Inhalation Given 03/29/22 1158)    ED Course/ Medical Decision Making/ A&P Clinical Course as of 03/29/22 1233  Fri Mar 29, 2022  1211 Got covid 19 approx 1 year ago.  States since then he's had issues breathing. 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  [WF]    Clinical Course User Index [WF] Tedd Sias, [place]                           Medical Decision Making Amount and/or Complexity of Data Reviewed Radiology: ordered.  Risk OTC drugs. Prescription drug management.    Patient is a 26 year old male with past medical history significant for asthma  Patient states that approximately 1 year ago he contracted COVID-19 he states that since that time he has felt somewhat more short of breath when he is physically pushing his limits that he had previously.  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  He states when he is walking normally he does not become dyspneic.  He denies any chest pains no hemoptysis no leg swelling either unilaterally or bilaterally.  No history of VTE, he denies any pain syncope or near syncope.  He  came to the ER today for evaluation and also inquired about FMLA because of his symptoms   The causes for shortness of breath include but are not limited to Cardiac (AHF, pericardial effusion and tamponade, arrhythmias, ischemia, etc) Respiratory (COPD, asthma, pneumonia, pneumothorax, primary pulmonary hypertension, PE/VQ mismatch) Hematological (anemia) Neuromuscular (ALS, Guillain-Barr, etc)    Physical exam without wheezing rales and without tachypnea hypoxia or any other abnormal findings.  Patient is well-appearing  on my exam breathing with normal volumes and rate.  No abnormal lung sounds.  I personally reviewed chest x-ray which is without any acute findings no pneumothorax, pneumonia, other abnormal findings.  I also personally reviewed EKG which is normal sinus rhythm.  No evidence of acute ischemia. Interpreted by Dr. Elpidio Anis as well.   We will discharge patient home at this time.  Return precautions discussed and understood.  He will follow-up with PCP.  Final Clinical Impression(s) / ED Diagnoses Final diagnoses:  Mild intermittent asthma, unspecified whether complicated    Rx / DC Orders ED Discharge Orders          Ordered    fexofenadine (ALLEGRA ALLERGY) 60 MG tablet  Daily        03/29/22 1220    albuterol (VENTOLIN HFA) 108 (90 Base) MCG/ACT inhaler  Every 6 hours PRN        03/29/22 1222              Tedd Sias, [place] 03/29/22 1258    Lennice Sites, DO 03/29/22 1313

## 2022-03-29 NOTE — Discharge Instructions (Signed)
Please make sure that you take an Allegra or other allergy medicine nightly. I also recommend using fluticasone/Flonase which is an intranasal steroid.  This should help with allergy symptoms and will also help with asthma and breathing  Make sure you are drinking plenty of water, take breaks as needed throughout the day at work.  I recommend doing 2 puffs at a time on the inhaler if you remain feeling mildly short of breath.  If you are feeling significantly short of breath I recommend stopping what you are doing resting and if you continue to feel short of breath take 4 puffs of the inhaler.    You may always return to the emergency room for any new or concerning symptoms.  Specifically please return if you experience any coughing up of blood, chest pain, significant shortness of breath or other concerning symptoms.  Otherwise please follow-up with your primary care provider and discuss these symptoms with them

## 2023-01-19 IMAGING — DX DG SHOULDER 2+V*L*
3 series · 3 of 3 positions shown · non-contrast
Comparison: None.

CLINICAL DATA: Tenderness at distal clavicle

EXAM:
LEFT SHOULDER - 2+ VIEW

[shoulder grashey]
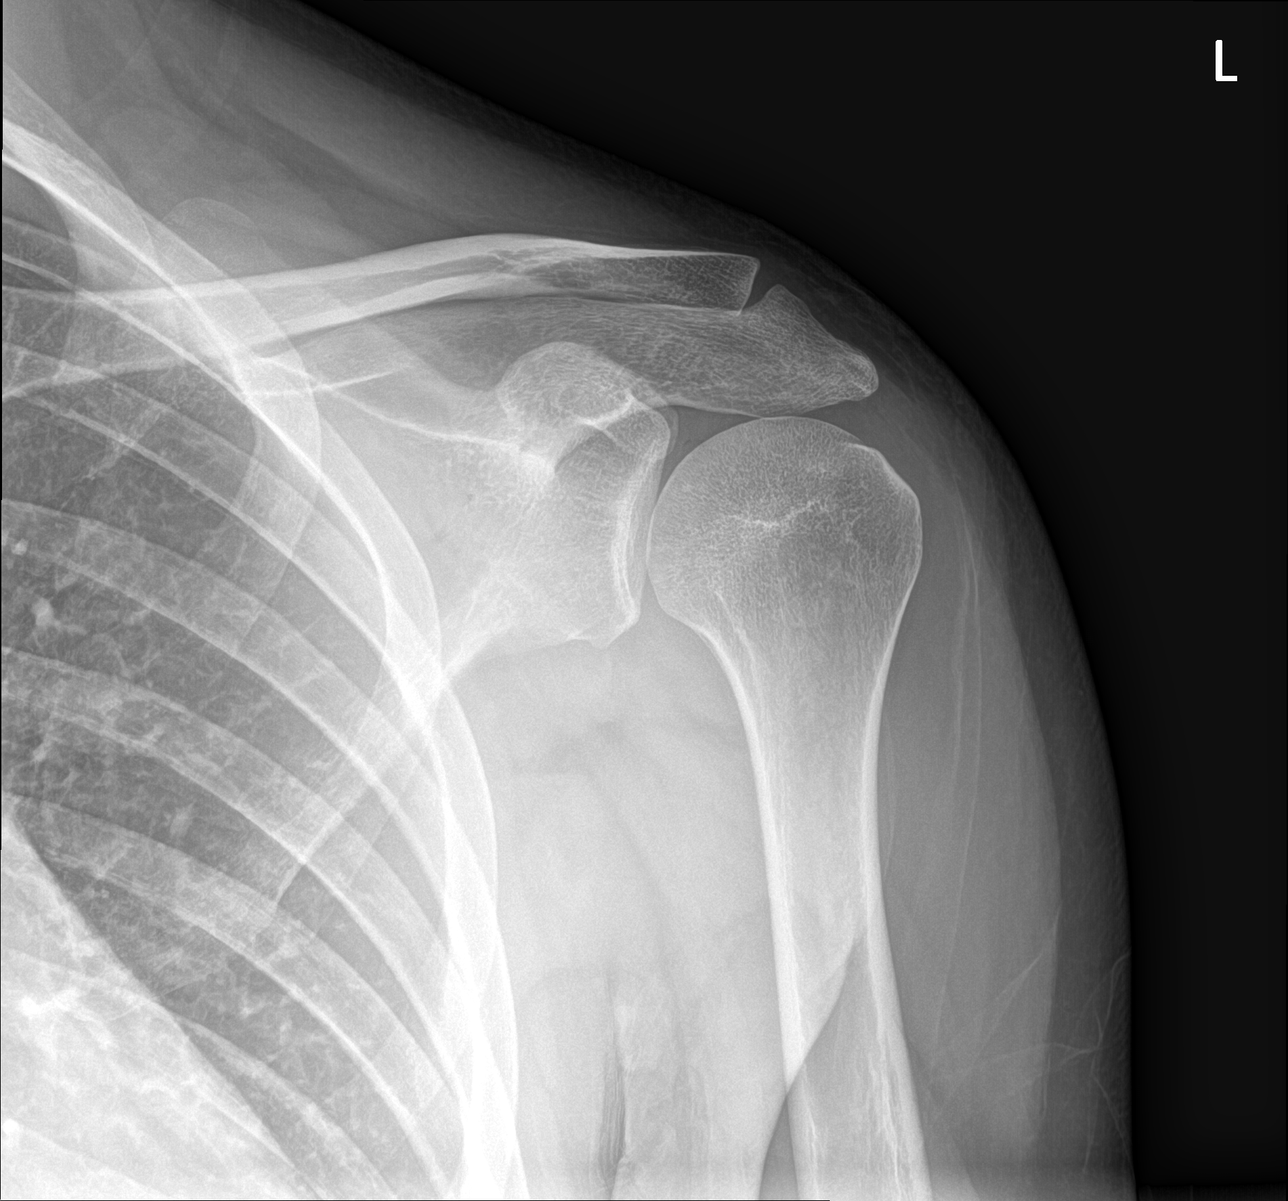

[shoulder y view]
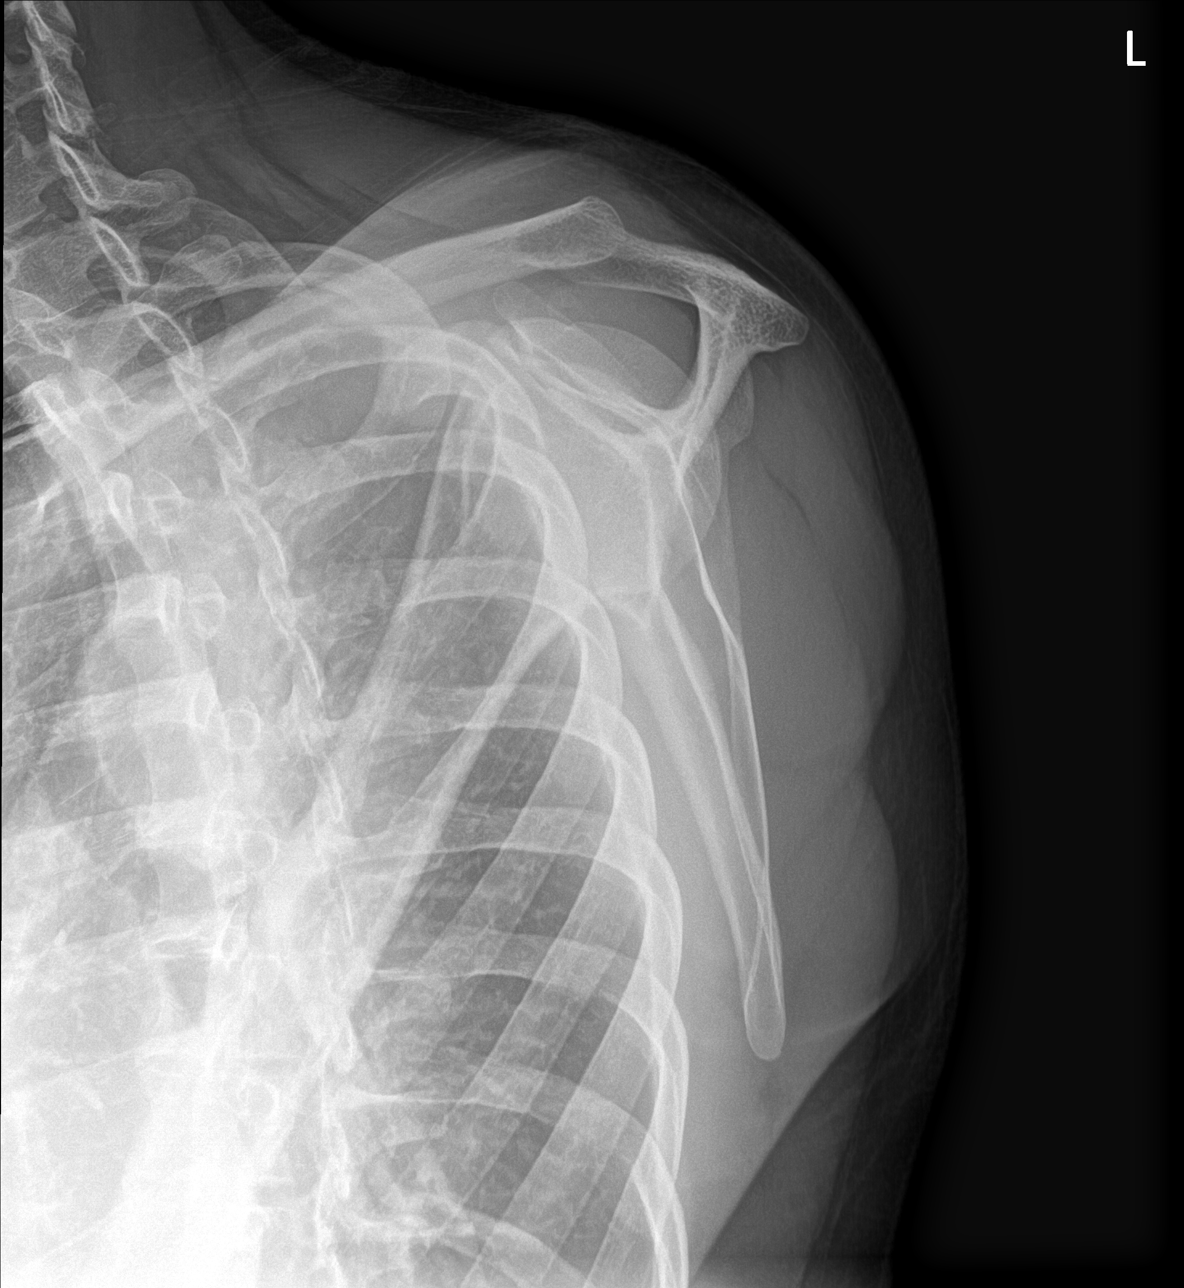

[shoulder ap]
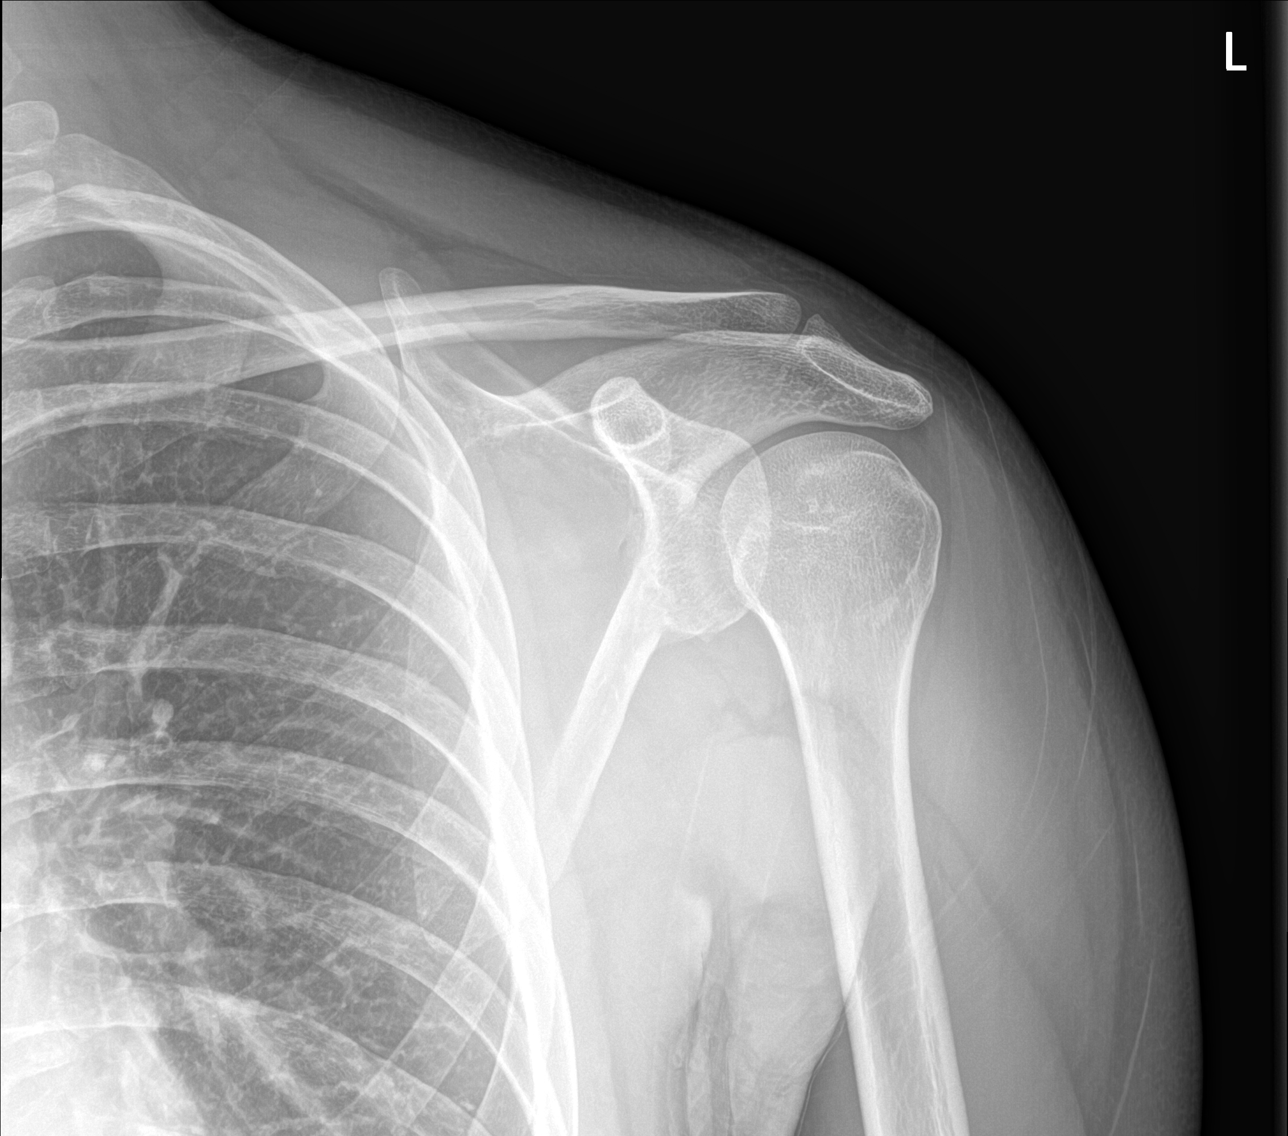

[3 of 3 positions shown; findings below may reference images not displayed]

FINDINGS: There is no evidence of fracture or dislocation. There is no
evidence of arthropathy or other focal bone abnormality. Soft
tissues are unremarkable.
IMPRESSION: Negative.
